# Patient Record
Sex: Male | Born: 1995 | Race: White | Hispanic: No | Marital: Single | Smoking: Current some day smoker
Health system: Southern US, Community
[De-identification: ages and names within clinical notes are randomized; demographics above are authoritative.]

## PROBLEM LIST (undated history)

## (undated) DIAGNOSIS — J45909 Unspecified asthma, uncomplicated: Secondary | ICD-10-CM

## (undated) HISTORY — PX: OTHER SURGICAL HISTORY: SHX169

---
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
## 2017-07-23 ENCOUNTER — Encounter: Payer: Self-pay | Admitting: *Deleted

## 2017-07-23 ENCOUNTER — Other Ambulatory Visit: Payer: Self-pay

## 2017-07-23 ENCOUNTER — Emergency Department: Payer: No Typology Code available for payment source

## 2017-07-23 ENCOUNTER — Emergency Department
Admission: EM | Admit: 2017-07-23 | Discharge: 2017-07-23 | Disposition: A | Discharge status: Home / Self Care | Payer: No Typology Code available for payment source | Attending: Emergency Medicine | Admitting: Emergency Medicine

## 2017-07-23 DIAGNOSIS — S01511A Laceration without foreign body of lip, initial encounter: Secondary | ICD-10-CM

## 2017-07-23 DIAGNOSIS — Y92410 Unspecified street and highway as the place of occurrence of the external cause: Secondary | ICD-10-CM | POA: Diagnosis not present

## 2017-07-23 DIAGNOSIS — J45909 Unspecified asthma, uncomplicated: Secondary | ICD-10-CM | POA: Insufficient documentation

## 2017-07-23 DIAGNOSIS — Y939 Activity, unspecified: Secondary | ICD-10-CM | POA: Insufficient documentation

## 2017-07-23 DIAGNOSIS — F17200 Nicotine dependence, unspecified, uncomplicated: Secondary | ICD-10-CM | POA: Insufficient documentation

## 2017-07-23 DIAGNOSIS — S0990XA Unspecified injury of head, initial encounter: Secondary | ICD-10-CM | POA: Diagnosis present

## 2017-07-23 DIAGNOSIS — Z23 Encounter for immunization: Secondary | ICD-10-CM | POA: Insufficient documentation

## 2017-07-23 DIAGNOSIS — S0501XA Injury of conjunctiva and corneal abrasion without foreign body, right eye, initial encounter: Secondary | ICD-10-CM | POA: Diagnosis not present

## 2017-07-23 DIAGNOSIS — Y999 Unspecified external cause status: Secondary | ICD-10-CM | POA: Diagnosis not present

## 2017-07-23 MED ORDER — POLYMYXIN B-TRIMETHOPRIM 10000-0.1 UNIT/ML-% OP SOLN: 2.0000 [drp] | OPHTHALMIC | 0 refills | Status: AC

## 2017-07-23 MED ORDER — TETANUS-DIPHTH-ACELL PERTUSSIS 5-2.5-18.5 LF-MCG/0.5 IM SUSP
0.5000 mL | Freq: Once | INTRAMUSCULAR | Status: AC
  Administered 2017-07-23: 0.5 mL via INTRAMUSCULAR
  Filled 2017-07-23: qty 0.5

## 2017-07-23 MED ORDER — FLUORESCEIN SODIUM 1 MG OP STRP
1.0000 | ORAL_STRIP | Freq: Once | OPHTHALMIC | Status: AC
  Administered 2017-07-23: 1 via OPHTHALMIC
  Filled 2017-07-23: qty 1

## 2017-07-23 MED ORDER — TETRACAINE HCL 0.5 % OP SOLN
1.0000 [drp] | Freq: Once | OPHTHALMIC | Status: AC
  Administered 2017-07-23: 1 [drp] via OPHTHALMIC
  Filled 2017-07-23: qty 4

## 2017-07-23 MED ORDER — CLINDAMYCIN HCL 150 MG PO CAPS: 300.0000 mg | ORAL_CAPSULE | Freq: Four times a day (QID) | ORAL | 0 refills | Status: AC
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
## 2022-01-21 ENCOUNTER — Emergency Department
Admission: EM | Admit: 2022-01-21 | Discharge: 2022-01-22 | Disposition: A | Discharge status: Home / Self Care | Payer: Self-pay | Attending: Emergency Medicine | Admitting: Emergency Medicine

## 2022-01-21 ENCOUNTER — Emergency Department: Payer: Self-pay

## 2022-01-21 DIAGNOSIS — Z20822 Contact with and (suspected) exposure to covid-19: Secondary | ICD-10-CM | POA: Insufficient documentation

## 2022-01-21 DIAGNOSIS — J45909 Unspecified asthma, uncomplicated: Secondary | ICD-10-CM | POA: Insufficient documentation

## 2022-01-21 DIAGNOSIS — R0781 Pleurodynia: Secondary | ICD-10-CM | POA: Insufficient documentation

## 2022-01-21 DIAGNOSIS — R0602 Shortness of breath: Secondary | ICD-10-CM | POA: Insufficient documentation

## 2022-01-21 LAB — CBC
HCT: 38.1 % — ABNORMAL LOW (ref 39.0–52.0)
Hemoglobin: 12.6 g/dL — ABNORMAL LOW (ref 13.0–17.0)
MCH: 30.1 pg (ref 26.0–34.0)
MCHC: 33.1 g/dL (ref 30.0–36.0)
MCV: 91.1 fL (ref 80.0–100.0)
Platelets: 224 10*3/uL (ref 150–400)
RBC: 4.18 MIL/uL — ABNORMAL LOW (ref 4.22–5.81)
RDW: 12 % (ref 11.5–15.5)
WBC: 8.8 10*3/uL (ref 4.0–10.5)
nRBC: 0 % (ref 0.0–0.2)

## 2022-01-21 LAB — BASIC METABOLIC PANEL
Anion gap: 10 (ref 5–15)
BUN: 13 mg/dL (ref 6–20)
CO2: 25 mmol/L (ref 22–32)
Calcium: 9.4 mg/dL (ref 8.9–10.3)
Chloride: 106 mmol/L (ref 98–111)
Creatinine, Ser: 0.97 mg/dL (ref 0.61–1.24)
GFR, Estimated: >60 mL/min (ref >60)
Glucose, Bld: 96 mg/dL (ref 70–99)
Potassium: 4.1 mmol/L (ref 3.5–5.1)
Sodium: 141 mmol/L (ref 135–145)

## 2022-01-21 LAB — TROPONIN I (HIGH SENSITIVITY)
Troponin I (High Sensitivity): <2 ng/L (ref <18)
Troponin I (High Sensitivity): <2 ng/L (ref <18)

## 2022-01-21 MED ORDER — SODIUM CHLORIDE 0.9 % IV BOLUS (SEPSIS)
1000.0000 mL | Freq: Once | INTRAVENOUS | Status: AC
  Administered 2022-01-22: 1000 mL via INTRAVENOUS

## 2022-01-21 NOTE — ED Notes (Signed)
Pt is a difficult stick, IV team consulted for [place] IV. ?

## 2022-01-21 NOTE — ED Provider Notes (Signed)
? ?[facility] ?Provider Note ? ? ? Event Date/Time  ? First MD Initiated Contact with Patient 01/21/22 2259   ?  (approximate) ? ? ?History  ? ?Shortness of Breath ? ? ?HPI ? ?Corey Arias is a 26 y.o. 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  No fevers, cough.  No lower extremity swelling or pain.  No history of PE or DVT.  No previous history of pneumothorax. ? ? ? ?History provided by patient and mother. ? ? ? ?Past Medical History:  ?Diagnosis Date  ? Asthma   ? ? ?Past Surgical History:  ?Procedure Laterality Date  ? bronchoscopy    ? ? ?MEDICATIONS:  ?Prior to Admission medications   ?Medication Sig Start Date End Date Taking? Authorizing Provider  ?trimethoprim-polymyxin b (POLYTRIM) ophthalmic solution Place 2 drops every 4 (four) hours into the right eye. 07/23/17   Merrily Brittleifenbark, Neil, MD  ? ? ?Physical Exam  ? ?Triage Vital Signs: ?ED Triage Vitals  ?Enc Vitals Group  ?   BP 01/21/22 2003 (!) 149/90  ?   Pulse Rate 01/21/22 2003 62  ?   Resp 01/21/22 2003 17  ?   Temp 01/21/22 2003 98.2 ?F (36.8 ?C)  ?   Temp Source 01/21/22 2003 Oral  ?   SpO2 01/21/22 2003 100 %  ?   Weight 01/21/22 2005 185 lb (83.9 kg)  ?   Height 01/21/22 2005 6' (1.829 m)  ?   Head Circumference --   ?   Peak Flow --   ?   Pain Score 01/21/22 2004 6  ?   Pain Loc --   ?   Pain Edu? --   ?   Excl. in GC? --   ? ? ?Most recent vital signs: ?Vitals:  ? 01/21/22 2300 01/22/22 0205  ?BP: 120/71 (!) 138/92  ?Pulse: 61 65  ?Resp: 11 16  ?Temp:    ?SpO2: 98% 98%  ? ? ?CONSTITUTIONAL: Alert and oriented and responds appropriately to questions. Well-appearing; well-nourished ?HEAD: Normocephalic, atraumatic ?EYES: Conjunctivae clear, pupils appear equal, sclera nonicteric ?ENT: normal nose; moist mucous  membranes ?NECK: Supple, normal ROM ?CARD: RRR; S1 and S2 appreciated; no murmurs, no clicks, no rubs, no gallops ?RESP: Normal chest excursion without splinting or tachypnea; breath sounds clear and equal bilaterally; no wheezes, no rhonchi, no rales, no hypoxia or respiratory distress, speaking full sentences ?ABD/GI: Normal bowel sounds; non-distended; soft, non-tender, no rebound, no guarding, no peritoneal signs ?BACK: The back appears normal ?EXT: Normal ROM in all joints; no deformity noted, no edema; no cyanosis no calf tenderness or calf swelling ?SKIN: Normal color for age and race; warm; no rash on exposed skin ?NEURO: Moves all extremities equally, normal speech ?PSYCH: The patient's mood and manner are appropriate. ? ? ?ED Results / Procedures / Treatments  ? ?LABS: ?(all labs ordered are listed, but only abnormal results are displayed) ?Labs Reviewed  ?CBC - Abnormal; Notable for the following components:  ?    Result Value  ? RBC 4.18 (*)   ? Hemoglobin 12.6 (*)   ? HCT 38.1 (*)   ? All other components within normal limits  ?RESP PANEL BY RT-PCR (FLU A&B, COVID) ARPGX2  ?BASIC METABOLIC PANEL  ?TROPONIN I (HIGH SENSITIVITY)  ?TROPONIN I (HIGH  SENSITIVITY)  ? ? ? ?EKG: ? EKG Interpretation ? ?Date/Time:  Monday Jan 21 2022 20:06:51 EDT ?Ventricular Rate:  64 ?PR Interval:  174 ?QRS Duration: 94 ?QT Interval:  442 ?QTC Calculation: 455 ?R Axis:   49 ?Text Interpretation: Normal sinus rhythm Cannot rule out Inferior infarct , age undetermined Abnormal ECG When compared with ECG of 16-Sep-2015 23:00, PREVIOUS ECG IS PRESENT Confirmed by Rochele Raring [phone]) on 01/21/2022 11:00:21 PM ?  ? ?  ? ? ? ?RADIOLOGY: ?My personal review and interpretation of imaging: Chest x-ray and CT chest show no acute abnormality. ? ?I have personally reviewed all radiology reports.   ?DG Chest 2 View ? ?Result Date: 01/21/2022 ?CLINICAL DATA:  Shortness of breath EXAM: CHEST - 2 VIEW COMPARISON:  12/04/2005 FINDINGS: Lungs  are clear.  No pleural effusion or pneumothorax. The heart is normal in size. Visualized osseous structures are within normal limits. IMPRESSION: Normal chest radiographs. Electronically Signed   By: Charline Bills M.D.   On: 01/21/2022 20:29  ? ?CT Angio Chest PE W and/or Wo Contrast ? ?Result Date: 01/22/2022 ?CLINICAL DATA:  Shortness of breath, history of pneumothorax EXAM: CT ANGIOGRAPHY CHEST WITH CONTRAST TECHNIQUE: Multidetector CT imaging of the chest was performed using the standard protocol during bolus administration of intravenous contrast. Multiplanar CT image reconstructions and MIPs were obtained to evaluate the vascular anatomy. RADIATION DOSE REDUCTION: This exam was performed according to the departmental dose-optimization program which includes automated exposure control, adjustment of the mA and/or kV according to patient size and/or use of iterative reconstruction technique. CONTRAST:  18mL OMNIPAQUE IOHEXOL 350 MG/ML SOLN COMPARISON:  Chest x-ray from the previous day. FINDINGS: Cardiovascular: Thoracic aorta shows no aneurysmal dilatation or findings of dissection. No cardiac enlargement is seen. The pulmonary artery shows a normal branching pattern bilaterally. No filling defect to suggest pulmonary embolism is noted. No coronary calcifications are seen. Mediastinum/Nodes: Thoracic inlet is within normal limits. No hilar or mediastinal adenopathy is noted. The esophagus as visualized is within normal limits. Lungs/Pleura: Lungs are well aerated bilaterally. No focal infiltrate or sizable effusion is seen. A few small subpleural nodules are seen on the right in the upper lobe measuring less than 5 mm. A few similar nodules are noted on the left. No evidence of pneumothorax is seen. Upper Abdomen: Upper abdomen is within normal limits. Musculoskeletal: No chest wall abnormality. No acute or significant osseous findings. Review of the MIP images confirms the above findings. IMPRESSION: No  evidence of pulmonary emboli. Several small subpleural nodules measuring less than 5 mm. No further follow-up is recommended. Electronically Signed   By: Alcide Clever M.D.   On: 01/22/2022 01:17   ? ? ?PROCEDURES: ? ?Critical Care performed: No ? ? ? ? ?.1-3 Lead EKG Interpretation ?Performed by: Austyn Perriello, Layla Maw, DO ?Authorized by: Ayala Ribble, Layla Maw, DO  ? ?  Interpretation: normal   ?  ECG rate:  65 ?  ECG rate assessment: normal   ?  Rhythm: sinus rhythm   ?  Ectopy: none   ?  Conduction: normal   ? ? ? ?IMPRESSION / MDM / ASSESSMENT AND PLAN / ED COURSE  ?I reviewed the triage vital signs and the nursing notes. ? ? ? ?Patient here with left-sided chest pain that is pleuritic in nature with shortness of breath. ? ?The patient is on the cardiac monitor to evaluate for evidence of arrhythmia and/or significant heart rate changes. ? ? ?DIFFERENTIAL DIAGNOSIS (includes but not limited to):  Pneumonia, PE, pleurisy, chest wall pain, pneumothorax ? ? ?PLAN: We will obtain CBC, BMP, troponin, chest x-ray, COVID and flu swab.  Patient declines any pain medication at this time. ? ? ?MEDICATIONS GIVEN IN ED: ?Medications  ?sodium chloride 0.9 % bolus 1,000 mL (0 mLs Intravenous Stopped 01/22/22 0158)  ?iohexol (OMNIPAQUE) 350 MG/ML injection 75 mL (75 mLs Intravenous Contrast Given 01/22/22 0102)  ? ? ? ?ED COURSE: Patient's labs show no leukocytosis.  Normal hemoglobin.  Normal electrolytes.  Troponin negative x2.  COVID and flu negative.  Chest x-ray interpreted and reviewed by myself and shows no infiltrate, edema, pneumothorax or other abnormality.  Given his history we will proceed with CTA of the chest. ? ? ? ?CT of the chest obtained and reviewed and interpreted by myself and radiology.  No PE, infiltrate, edema, consolidation.  Several small subpleural nodules measuring less than 5 mm.  Discussed this finding with family.  No further follow-up needed for this.  Discussed that the symptoms could be due to chest wall  pain.  Nothing that requires antibiotics today.  I feel he is safe for discharge home.  They are comfortable with this plan.  May use over-the-counter Tylenol, Motrin as needed. ? ? ?CONSULTS: No hospitalization neede

## 2022-01-21 NOTE — ED Triage Notes (Signed)
Pt presents via POV c/o SOB for a "couple of months". Reports hx pneumothorax. Ambulatory to triage.  ?

## 2022-01-22 ENCOUNTER — Emergency Department: Payer: Self-pay

## 2022-01-22 LAB — RESP PANEL BY RT-PCR (FLU A&B, COVID) ARPGX2
Influenza A by PCR: NEGATIVE
Influenza B by PCR: NEGATIVE
SARS Coronavirus 2 by RT PCR: NEGATIVE

## 2022-01-22 MED ORDER — IOHEXOL 350 MG/ML SOLN
75.0000 mL | Freq: Once | INTRAVENOUS | Status: AC | PRN
  Administered 2022-01-22: 75 mL via INTRAVENOUS

## 2022-01-22 NOTE — Discharge Instructions (Signed)
Steps to find a Primary Care Provider (PCP):  Call [phone] or [phone] to access "[place] Find a Doctor Service."  2.  You may also go on the [place] website at [email or web address]  

## 2022-09-19 IMAGING — CT CT ANGIO CHEST
2 of 6 series · 18 of 46 positions shown · IV contrast (APPLIED)
Comparison: Chest x-ray from the previous day.

CLINICAL DATA: Shortness of breath, history of pneumothorax

EXAM:
CT ANGIOGRAPHY CHEST WITH CONTRAST
TECHNIQUE: Multidetector CT imaging of the chest was performed using the
standard protocol during bolus administration of intravenous
contrast. Multiplanar CT image reconstructions and MIPs were
obtained to evaluate the vascular anatomy.

[Series 6: thins · axial · 0.71mm/px · z∈[-698,-425]mm · 15 of 428 slices shown]
[im 19/428  lung]
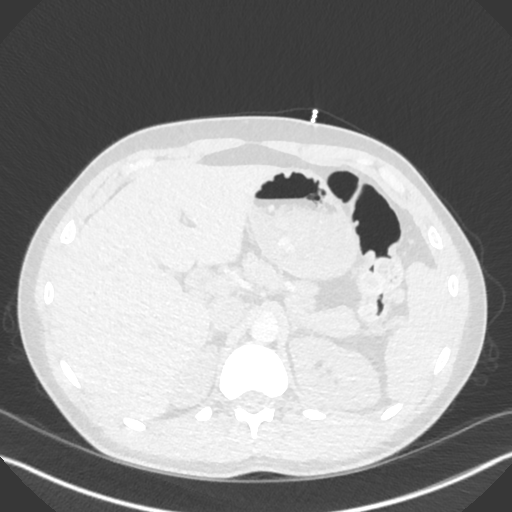
[im 56/428  soft-tissue]
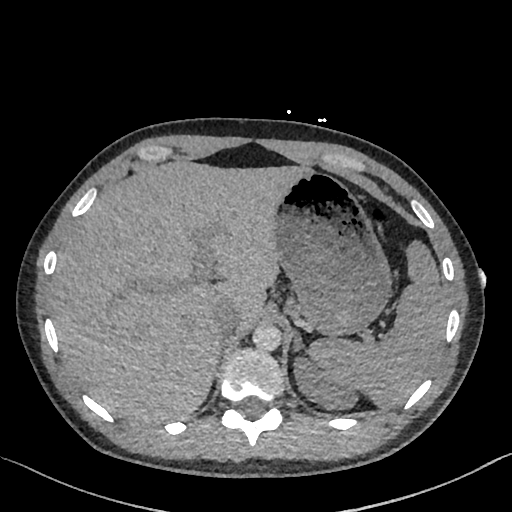
[im 75/428  lung]
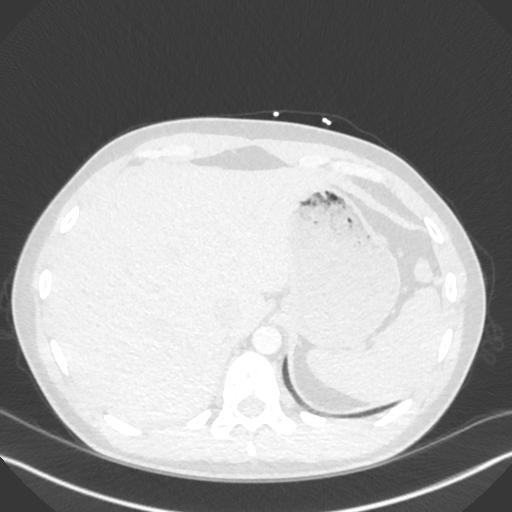
[im 112/428  soft-tissue]
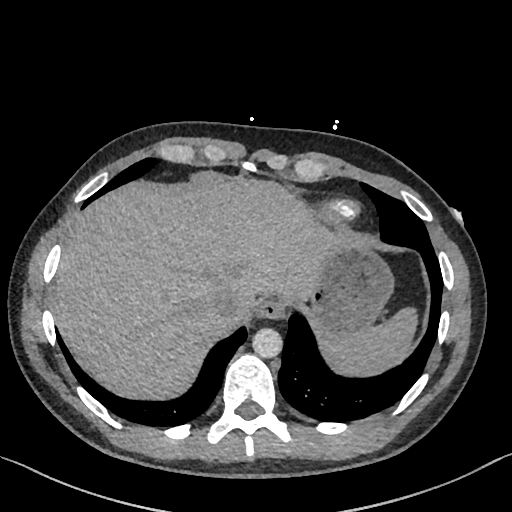
[im 130/428  lung]
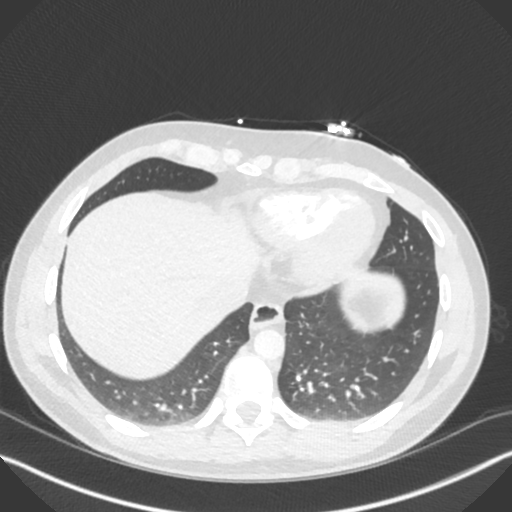
[im 168/428  soft-tissue]
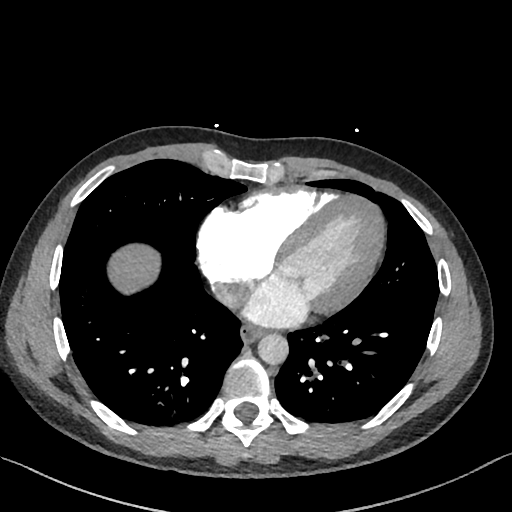
[im 186/428  lung]
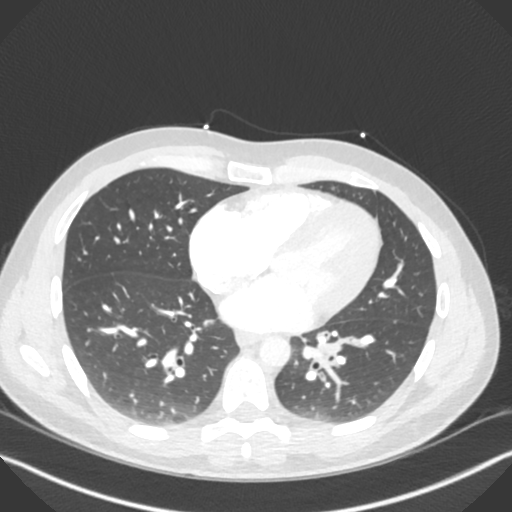
[im 223/428  soft-tissue]
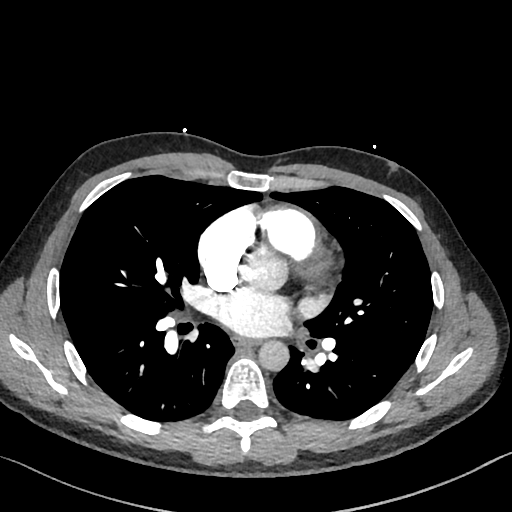
[im 242/428  lung]
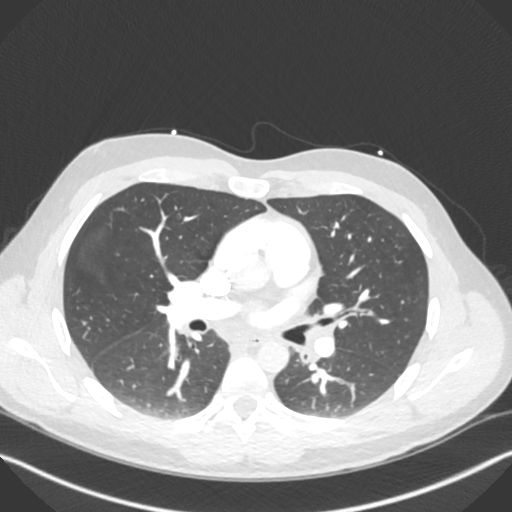
[im 260/428  soft-tissue]
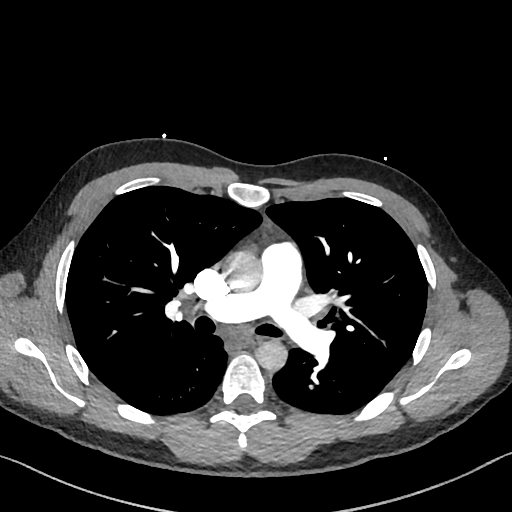
[im 298/428  lung]
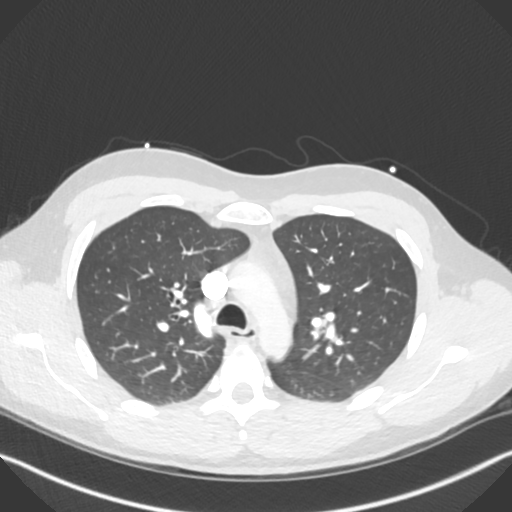
[im 316/428  soft-tissue]
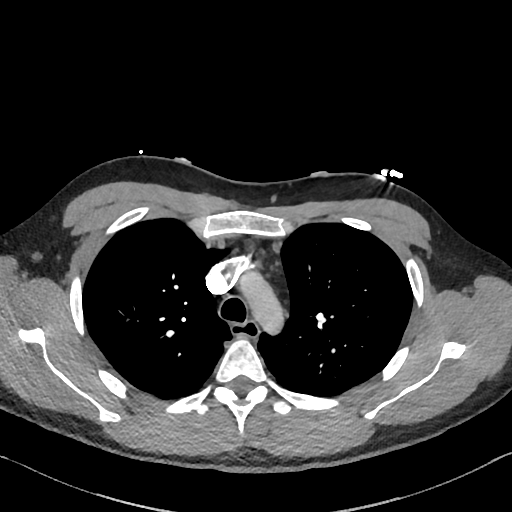
[im 353/428  lung]
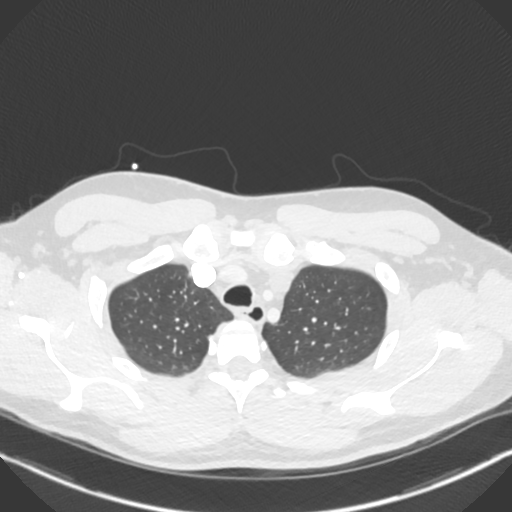
[im 372/428  soft-tissue]
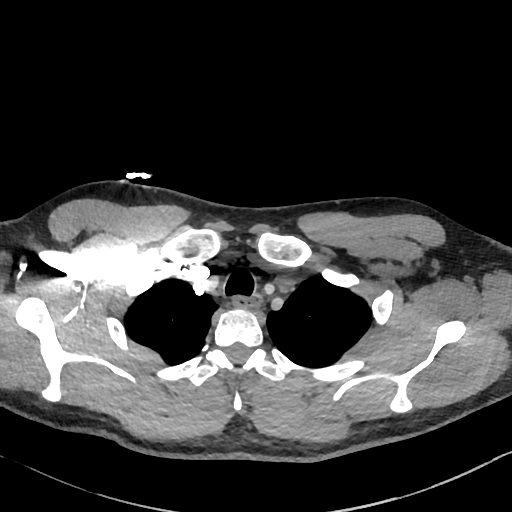
[im 409/428  lung]
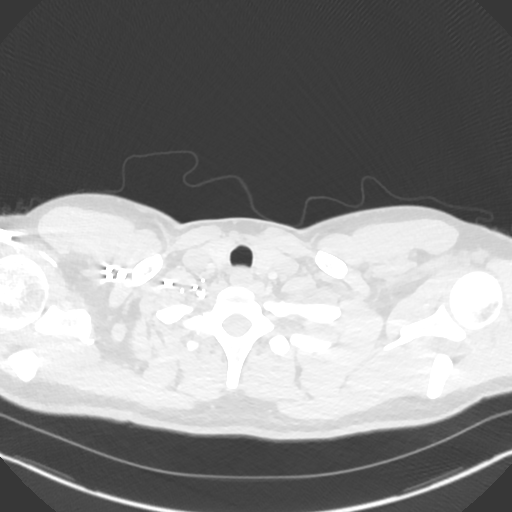

[Series 7: cor · coronal · 0.60mm/px · 3 of 121 slices shown]
[im 31/121  soft-tissue]
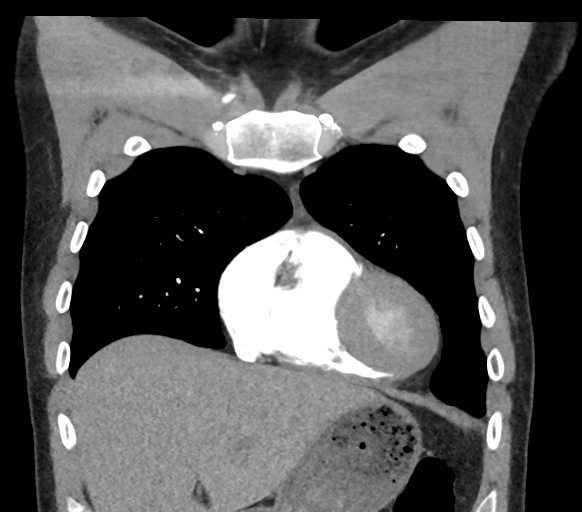
[im 61/121  soft-tissue]
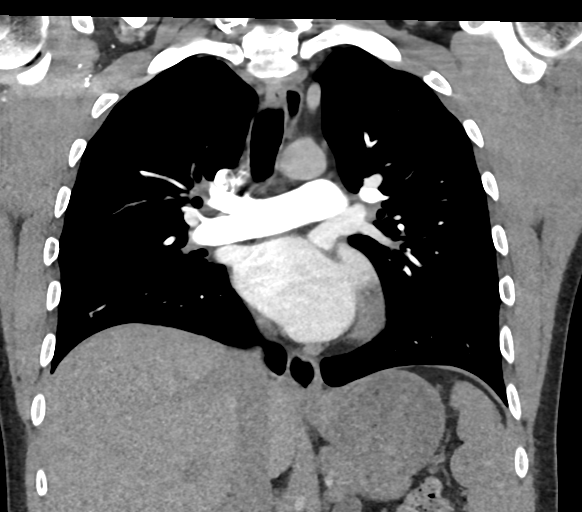
[im 91/121  soft-tissue]
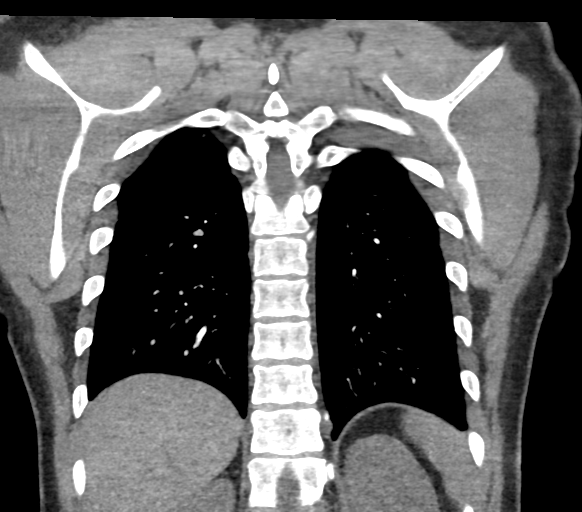

[18 of 46 positions shown; findings below may reference images not displayed]

RADIATION DOSE REDUCTION: This exam was performed according to the
departmental dose-optimization program which includes automated
exposure control, adjustment of the mA and/or kV according to
patient size and/or use of iterative reconstruction technique.

CONTRAST:  75mL OMNIPAQUE IOHEXOL 350 MG/ML SOLN
FINDINGS: Cardiovascular: Thoracic aorta shows no aneurysmal dilatation or
findings of dissection. No cardiac enlargement is seen. The
pulmonary artery shows a normal branching pattern bilaterally. No
filling defect to suggest pulmonary embolism is noted. No coronary
calcifications are seen.

Mediastinum/Nodes: Thoracic inlet is within normal limits. No hilar
or mediastinal adenopathy is noted. The esophagus as visualized is
within normal limits.

Lungs/Pleura: Lungs are well aerated bilaterally. No focal
infiltrate or sizable effusion is seen. A few small subpleural
nodules are seen on the right in the upper lobe measuring less than
5 mm. A few similar nodules are noted on the left. No evidence of
pneumothorax is seen.

Upper Abdomen: Upper abdomen is within normal limits.

Musculoskeletal: No chest wall abnormality. No acute or significant
osseous findings.

Review of the MIP images confirms the above findings.
IMPRESSION: No evidence of pulmonary emboli.

Several small subpleural nodules measuring less than 5 mm. No
further follow-up is recommended.

## 2024-02-21 ENCOUNTER — Emergency Department
Admission: EM | Admit: 2024-02-21 | Discharge: 2024-02-21 | Disposition: A | Discharge status: Home / Self Care | Payer: Self-pay | Attending: Emergency Medicine | Admitting: Emergency Medicine

## 2024-02-21 ENCOUNTER — Other Ambulatory Visit: Payer: Self-pay

## 2024-02-21 DIAGNOSIS — L237 Allergic contact dermatitis due to plants, except food: Secondary | ICD-10-CM | POA: Insufficient documentation

## 2024-02-21 MED ORDER — PREDNISONE 10 MG (48) PO TBPK: ORAL_TABLET | ORAL | 0 refills | Status: AC

## 2024-02-21 MED ORDER — DEXAMETHASONE SODIUM PHOSPHATE 10 MG/ML IJ SOLN
10.0000 mg | Freq: Once | INTRAMUSCULAR | Status: AC
  Administered 2024-02-21: 10 mg via INTRAMUSCULAR
  Filled 2024-02-21: qty 1

## 2024-02-21 NOTE — Discharge Instructions (Signed)
 He is calamine lotion and hydrocortisone cream to the affected areas Use the steroid pack as prescribed Return emergency department worsening, see your regular doctor as needed

## 2024-02-21 NOTE — ED Triage Notes (Signed)
 Pt to ED for poison ivy rash since yesterday. Both legs and arms. Pt in no acute distress.

## 2024-02-21 NOTE — ED Provider Notes (Signed)
 [facility] Provider Note    Event Date/Time   First MD Initiated Contact with Patient 02/21/24 1833     (approximate)   History   Rash and Poison Ivy   HPI  Corey Arias is a 28 y.o. male with no significant past medical history presents emergency department with a rash and questionable poison ivy.  Patient works for [employer].  States that his friend came to work and had same type rash after walking his dog.  Denies tick bite.  States areas are itchy      Physical Exam   Triage Vital Signs: ED Triage Vitals  Encounter Vitals Group     BP 02/21/24 1717 132/77     Girls Systolic BP Percentile --      Girls Diastolic BP Percentile --      Boys Systolic BP Percentile --      Boys Diastolic BP Percentile --      Pulse Rate 02/21/24 1717 76     Resp 02/21/24 1717 16     Temp 02/21/24 1717 97.7 F (36.5 C)     Temp Source 02/21/24 1717 Oral     SpO2 02/21/24 1717 100 %     Weight 02/21/24 1715 160 lb (72.6 kg)     Height 02/21/24 1715 5' 11 (1.803 m)     Head Circumference --      Peak Flow --      Pain Score 02/21/24 1715 0     Pain Loc --      Pain Education --      Exclude from Growth Chart --     Most recent vital signs: Vitals:   02/21/24 1717  BP: 132/77  Pulse: 76  Resp: 16  Temp: 97.7 F (36.5 C)  SpO2: 100%     General: Awake, no distress.   CV:  Good peripheral perfusion. Resp:  Normal effort.  Abd:  No distention.   Other:  Skin with rash typical contact dermatitis noted on the lower leg, right arm, and a few spots on his chest   ED Results / Procedures / Treatments   Labs (all labs ordered are listed, but only abnormal results are displayed) Labs Reviewed - No data to display   EKG     RADIOLOGY     PROCEDURES:   Procedures  Critical Care:  no Chief Complaint  Patient presents with   Rash   Poison Ivy      MEDICATIONS ORDERED IN ED: Medications  dexamethasone (DECADRON) injection 10  mg (10 mg Intramuscular Given 02/21/24 1944)     IMPRESSION / MDM / ASSESSMENT AND PLAN / ED COURSE  I reviewed the triage vital signs and the nursing notes.                              Differential diagnosis includes, but is not limited to, contact dermatitis, poison ivy, poison oak, tick bite  Patient's presentation is most consistent with acute illness / injury with system symptoms.    Medications given: Decadron 10 mg IM for poison ivy  Patient has not had a tick bite so I feel that [facility] spotted fever be less likely as time.  It is in a scattered pattern and feel this is most likely contact dermatitis from poison ivy or poison oak  I did explain these findings to patient.  Is given Decadron 10 mg IM here in  the ED the prescription for steroids 14-day Dosepak.  He is to follow-up with his regular doctor as needed.  Return if worsening.  He is in agreement treatment plan.  Discharged stable condition.      FINAL CLINICAL IMPRESSION(S) / ED DIAGNOSES   Final diagnoses:  Poison ivy     Rx / DC Orders   ED Discharge Orders          Ordered    predniSONE (STERAPRED UNI-PAK 48 TAB) 10 MG (48) TBPK tablet        02/21/24 1942             Note:  This document was prepared using Dragon voice recognition software and may include unintentional dictation errors.    Delsie Figures, PA-C 02/21/24 2253    Ruth Cove, MD 02/21/24 [phone]

## 2024-07-20 ENCOUNTER — Emergency Department
Admission: EM | Admit: 2024-07-20 | Discharge: 2024-07-20 | Disposition: A | Discharge status: Home / Self Care | Payer: MEDICAID | Attending: Emergency Medicine | Admitting: Emergency Medicine

## 2024-07-20 ENCOUNTER — Emergency Department: Payer: MEDICAID

## 2024-07-20 ENCOUNTER — Other Ambulatory Visit: Payer: Self-pay

## 2024-07-20 DIAGNOSIS — J45909 Unspecified asthma, uncomplicated: Secondary | ICD-10-CM | POA: Insufficient documentation

## 2024-07-20 DIAGNOSIS — R10A1 Flank pain, right side: Secondary | ICD-10-CM | POA: Diagnosis present

## 2024-07-20 DIAGNOSIS — N202 Calculus of kidney with calculus of ureter: Secondary | ICD-10-CM | POA: Diagnosis not present

## 2024-07-20 DIAGNOSIS — N201 Calculus of ureter: Secondary | ICD-10-CM

## 2024-07-20 LAB — CBC WITH DIFFERENTIAL/PLATELET
Abs Immature Granulocytes: 0.02 K/uL (ref 0.00–0.07)
Basophils Absolute: 0 K/uL (ref 0.0–0.1)
Basophils Relative: 0 %
Eosinophils Absolute: 0.1 K/uL (ref 0.0–0.5)
Eosinophils Relative: 2 %
HCT: 42.1 % (ref 39.0–52.0)
Hemoglobin: 13.9 g/dL (ref 13.0–17.0)
Immature Granulocytes: 0 %
Lymphocytes Relative: 48 %
Lymphs Abs: 2.4 K/uL (ref 0.7–4.0)
MCH: 29.6 pg (ref 26.0–34.0)
MCHC: 33 g/dL (ref 30.0–36.0)
MCV: 89.6 fL (ref 80.0–100.0)
Monocytes Absolute: 0.4 K/uL (ref 0.1–1.0)
Monocytes Relative: 7 %
Neutro Abs: 2.2 K/uL (ref 1.7–7.7)
Neutrophils Relative %: 43 %
Platelets: 244 K/uL (ref 150–400)
RBC: 4.7 MIL/uL (ref 4.22–5.81)
RDW: 11.9 % (ref 11.5–15.5)
WBC: 5.2 K/uL (ref 4.0–10.5)
nRBC: 0 % (ref 0.0–0.2)

## 2024-07-20 LAB — BASIC METABOLIC PANEL WITH GFR
Anion gap: 12 (ref 5–15)
BUN: 10 mg/dL (ref 6–20)
CO2: 24 mmol/L (ref 22–32)
Calcium: 9.1 mg/dL (ref 8.9–10.3)
Chloride: 103 mmol/L (ref 98–111)
Creatinine, Ser: 0.78 mg/dL (ref 0.61–1.24)
GFR, Estimated: >60 mL/min (ref >60)
Glucose, Bld: 134 mg/dL — ABNORMAL HIGH (ref 70–99)
Potassium: 3.5 mmol/L (ref 3.5–5.1)
Sodium: 139 mmol/L (ref 135–145)

## 2024-07-20 MED ORDER — KETOROLAC TROMETHAMINE 15 MG/ML IJ SOLN
15.0000 mg | Freq: Once | INTRAMUSCULAR | Status: AC
Start: 2024-07-20 — End: 2024-07-20
  Administered 2024-07-20: 15 mg via INTRAVENOUS
  Filled 2024-07-20: qty 1

## 2024-07-20 MED ORDER — SODIUM CHLORIDE 0.9 % IV BOLUS
1000.0000 mL | Freq: Once | INTRAVENOUS | Status: AC
  Administered 2024-07-20: 1000 mL via INTRAVENOUS

## 2024-07-20 MED ORDER — ONDANSETRON HCL 4 MG/2ML IJ SOLN
4.0000 mg | Freq: Once | INTRAMUSCULAR | Status: AC
  Administered 2024-07-20: 4 mg via INTRAVENOUS
  Filled 2024-07-20: qty 2

## 2024-07-20 NOTE — Discharge Instructions (Addendum)
 Take ibuprofen 600mg  every 6 hours as needed for persistent pain.  The pain should be much better going forward. Return to the ED if you have recurrent episodes of severe pain.

## 2024-07-20 NOTE — ED Provider Notes (Signed)
 [facility] Provider Note    Event Date/Time   First MD Initiated Contact with Patient 07/20/24 [phone]     (approximate)   History   Chief Complaint: Flank Pain   HPI  Corey Arias is a 28 y.o. male with no significant past medical history who comes ED complaining of right flank pain radiating around to the groin.  Associated with dysuria.  No fever, no chest pain or shortness of breath.  Does report recently having a stomach bug, having poor oral intake for the last week and feeling dehydrated.        Past Medical History:  Diagnosis Date   Asthma     Current Outpatient Rx   Order #: [id number] Class: Normal   Order #: [id number] Class: Print    Past Surgical History:  Procedure Laterality Date   bronchoscopy      Physical Exam   Triage Vital Signs: ED Triage Vitals  Encounter Vitals Group     BP 07/20/24 0511 (!) 146/81     Girls Systolic BP Percentile --      Girls Diastolic BP Percentile --      Boys Systolic BP Percentile --      Boys Diastolic BP Percentile --      Pulse Rate 07/20/24 0511 69     Resp 07/20/24 0511 20     Temp 07/20/24 0511 98.4 F (36.9 C)     Temp src --      SpO2 07/20/24 0511 100 %     Weight 07/20/24 0510 150 lb (68 kg)     Height 07/20/24 0510 5' 11 (1.803 m)     Head Circumference --      Peak Flow --      Pain Score 07/20/24 0510 8     Pain Loc --      Pain Education --      Exclude from Growth Chart --     Most recent vital signs: Vitals:   07/20/24 0511  BP: (!) 146/81  Pulse: 69  Resp: 20  Temp: 98.4 F (36.9 C)  SpO2: 100%    General: Awake, no distress.  CV:  Good peripheral perfusion.  Resp:  Normal effort.  Abd:  No distention.  Soft with right lower quadrant tenderness.  Right CVA tenderness. Other:     ED Results / Procedures / Treatments   Labs (all labs ordered are listed, but only abnormal results are displayed) Labs Reviewed  BASIC METABOLIC PANEL WITH GFR - Abnormal;  Notable for the following components:      Result Value   Glucose, Bld 134 (*)    All other components within normal limits  CBC WITH DIFFERENTIAL/PLATELET  URINALYSIS, ROUTINE W REFLEX MICROSCOPIC     EKG    RADIOLOGY CT abdomen pelvis interpreted by me, no obstructing ureterolithiasis.  Radiology report reviewed   PROCEDURES:  Procedures   MEDICATIONS ORDERED IN ED: Medications  sodium chloride  0.9 % bolus 1,000 mL (1,000 mLs Intravenous New Bag/Given 07/20/24 0530)  ondansetron (ZOFRAN) injection 4 mg (4 mg Intravenous Given 07/20/24 0529)  ketorolac (TORADOL) 15 MG/ML injection 15 mg (15 mg Intravenous Given 07/20/24 0530)     IMPRESSION / MDM / ASSESSMENT AND PLAN / ED COURSE  I reviewed the triage vital signs and the nursing notes.  DDx: Ureterolithiasis, appendicitis, UTI, muscle strain  Patient's presentation is most consistent with acute presentation with potential threat to life or bodily function.  Patient presents with  right flank pain and right lower quadrant abdominal pain with tenderness, starting this evening while working at a [employer].  Suspect renal colic.  Will obtain CT, labs.   ----------------------------------------- 6:39 AM on 07/20/2024 ----------------------------------------- CT shows recently passed small kidney stone explaining the symptoms.  Now calm and comfortable.  At this point I have low suspicion for UTI, and patient has no ureteral obstruction.  Stable for discharge      FINAL CLINICAL IMPRESSION(S) / ED DIAGNOSES   Final diagnoses:  Ureterolithiasis     Rx / DC Orders   ED Discharge Orders     None        Note:  This document was prepared using Dragon voice recognition software and may include unintentional dictation errors.   Viviann Pastor, MD 07/20/24 [phone]

## 2024-07-20 NOTE — ED Triage Notes (Signed)
 Pt reports right side flank pain that began 1 hour pta, pt also reports some dysuria. Denies hx of kidney stones. Pt also reports some discomfort in testicles.
